# Patient Record
Sex: Male | Born: 1975
Health system: Southern US, Community
[De-identification: ages and names within clinical notes are randomized; demographics above are authoritative.]

## PROBLEM LIST (undated history)

## (undated) ENCOUNTER — Emergency Department (HOSPITAL_COMMUNITY): Discharge status: Against Medical Advice | Payer: No Typology Code available for payment source

## (undated) DIAGNOSIS — J45909 Unspecified asthma, uncomplicated: Secondary | ICD-10-CM

## (undated) HISTORY — PX: APPENDECTOMY: SHX54

---

## 2001-02-27 ENCOUNTER — Emergency Department (HOSPITAL_COMMUNITY): Admission: EM | Admit: 2001-02-27 | Discharge: 2001-02-27 | Payer: Self-pay | Admitting: Emergency Medicine

## 2001-11-15 ENCOUNTER — Emergency Department (HOSPITAL_COMMUNITY): Admission: EM | Admit: 2001-11-15 | Discharge: 2001-11-15 | Payer: Self-pay | Admitting: Emergency Medicine

## 2004-01-14 ENCOUNTER — Emergency Department (HOSPITAL_COMMUNITY): Admission: EM | Admit: 2004-01-14 | Discharge: 2004-01-14 | Payer: Self-pay | Admitting: Emergency Medicine

## 2005-07-19 ENCOUNTER — Emergency Department (HOSPITAL_COMMUNITY): Admission: EM | Admit: 2005-07-19 | Discharge: 2005-07-19 | Payer: Self-pay | Admitting: Emergency Medicine

## 2005-12-19 ENCOUNTER — Emergency Department (HOSPITAL_COMMUNITY): Admission: EM | Admit: 2005-12-19 | Discharge: 2005-12-19 | Payer: Self-pay | Admitting: Emergency Medicine

## 2008-12-14 ENCOUNTER — Emergency Department (HOSPITAL_COMMUNITY): Admission: EM | Admit: 2008-12-14 | Discharge: 2008-12-14 | Payer: Self-pay | Admitting: Emergency Medicine

## 2009-01-26 ENCOUNTER — Emergency Department (HOSPITAL_COMMUNITY): Admission: EM | Admit: 2009-01-26 | Discharge: 2009-01-26 | Payer: Self-pay | Admitting: Emergency Medicine

## 2009-02-18 ENCOUNTER — Emergency Department (HOSPITAL_COMMUNITY): Admission: EM | Admit: 2009-02-18 | Discharge: 2009-02-18 | Payer: Self-pay | Admitting: Emergency Medicine

## 2010-03-28 ENCOUNTER — Emergency Department (HOSPITAL_COMMUNITY)
Admission: EM | Admit: 2010-03-28 | Discharge: 2010-03-28 | Payer: Self-pay | Source: Home / Self Care | Admitting: Emergency Medicine

## 2010-07-24 ENCOUNTER — Emergency Department (HOSPITAL_COMMUNITY): Payer: No Typology Code available for payment source

## 2010-07-24 ENCOUNTER — Emergency Department (HOSPITAL_COMMUNITY)
Admission: EM | Admit: 2010-07-24 | Discharge: 2010-07-24 | Disposition: A | Discharge status: Home / Self Care | Payer: No Typology Code available for payment source | Attending: Emergency Medicine | Admitting: Emergency Medicine

## 2010-07-24 DIAGNOSIS — Y92410 Unspecified street and highway as the place of occurrence of the external cause: Secondary | ICD-10-CM | POA: Insufficient documentation

## 2010-07-24 DIAGNOSIS — S335XXA Sprain of ligaments of lumbar spine, initial encounter: Secondary | ICD-10-CM | POA: Insufficient documentation
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
## 2013-12-18 DIAGNOSIS — N390 Urinary tract infection, site not specified: Secondary | ICD-10-CM | POA: Insufficient documentation

## 2013-12-18 DIAGNOSIS — F17200 Nicotine dependence, unspecified, uncomplicated: Secondary | ICD-10-CM | POA: Insufficient documentation

## 2013-12-18 DIAGNOSIS — J45909 Unspecified asthma, uncomplicated: Secondary | ICD-10-CM | POA: Insufficient documentation

## 2013-12-18 DIAGNOSIS — R1031 Right lower quadrant pain: Secondary | ICD-10-CM | POA: Insufficient documentation

## 2013-12-19 ENCOUNTER — Emergency Department (HOSPITAL_COMMUNITY)
Admission: EM | Admit: 2013-12-19 | Discharge: 2013-12-19 | Disposition: A | Discharge status: Home / Self Care | Payer: No Typology Code available for payment source | Attending: Emergency Medicine | Admitting: Emergency Medicine

## 2013-12-19 ENCOUNTER — Emergency Department (HOSPITAL_COMMUNITY)
Admission: EM | Admit: 2013-12-19 | Discharge: 2013-12-19 | Disposition: A | Discharge status: Home / Self Care | Payer: No Typology Code available for payment source

## 2013-12-19 ENCOUNTER — Encounter (HOSPITAL_COMMUNITY): Payer: Self-pay | Admitting: Emergency Medicine

## 2013-12-19 DIAGNOSIS — N390 Urinary tract infection, site not specified: Secondary | ICD-10-CM

## 2013-12-19 HISTORY — DX: Unspecified asthma, uncomplicated: J45.909

## 2013-12-19 LAB — COMPREHENSIVE METABOLIC PANEL
ALK PHOS: 82 U/L (ref 39–117)
ALT: 11 U/L (ref 0–53)
AST: 14 U/L (ref 0–37)
Albumin: 3.9 g/dL (ref 3.5–5.2)
Anion gap: 12 (ref 5–15)
BUN: 11 mg/dL (ref 6–23)
CHLORIDE: 106 meq/L (ref 96–112)
CO2: 24 meq/L (ref 19–32)
Calcium: 9 mg/dL (ref 8.4–10.5)
Creatinine, Ser: 1.14 mg/dL (ref 0.50–1.35)
GFR, EST NON AFRICAN AMERICAN: 80 mL/min — AB (ref >90)
GLUCOSE: 96 mg/dL (ref 70–99)
POTASSIUM: 4.2 meq/L (ref 3.7–5.3)
SODIUM: 142 meq/L (ref 137–147)
Total Bilirubin: 0.2 mg/dL — ABNORMAL LOW (ref 0.3–1.2)
Total Protein: 7.1 g/dL (ref 6.0–8.3)

## 2013-12-19 LAB — CBC WITH DIFFERENTIAL/PLATELET
BASOS PCT: 0 % (ref 0–1)
Basophils Absolute: 0 10*3/uL (ref 0.0–0.1)
EOS ABS: 0.3 10*3/uL (ref 0.0–0.7)
EOS PCT: 3 % (ref 0–5)
HCT: 42 % (ref 39.0–52.0)
Hemoglobin: 14.2 g/dL (ref 13.0–17.0)
LYMPHS ABS: 2.6 10*3/uL (ref 0.7–4.0)
Lymphocytes Relative: 29 % (ref 12–46)
MCH: 30 pg (ref 26.0–34.0)
MCHC: 33.8 g/dL (ref 30.0–36.0)
MCV: 88.8 fL (ref 78.0–100.0)
MONOS PCT: 6 % (ref 3–12)
Monocytes Absolute: 0.5 10*3/uL (ref 0.1–1.0)
NEUTROS PCT: 62 % (ref 43–77)
Neutro Abs: 5.6 10*3/uL (ref 1.7–7.7)
PLATELETS: 262 10*3/uL (ref 150–400)
RBC: 4.73 MIL/uL (ref 4.22–5.81)
RDW: 13.2 % (ref 11.5–15.5)
WBC: 9 10*3/uL (ref 4.0–10.5)

## 2013-12-19 LAB — URINALYSIS, ROUTINE W REFLEX MICROSCOPIC
BILIRUBIN URINE: NEGATIVE
GLUCOSE, UA: NEGATIVE mg/dL
KETONES UR: NEGATIVE mg/dL
Leukocytes, UA: NEGATIVE
Nitrite: NEGATIVE
PH: 5.5 (ref 5.0–8.0)
PROTEIN: 30 mg/dL — AB
Specific Gravity, Urine: 1.025 (ref 1.005–1.030)
Urobilinogen, UA: 0.2 mg/dL (ref 0.0–1.0)

## 2013-12-19 LAB — URINE MICROSCOPIC-ADD ON

## 2013-12-19 LAB — LIPASE, BLOOD: LIPASE: 34 U/L (ref 11–59)

## 2013-12-19 MED ORDER — OXYCODONE-ACETAMINOPHEN 5-325 MG PO TABS: 2.0000 | ORAL_TABLET | ORAL | Status: DC | PRN

## 2013-12-19 MED ORDER — CEPHALEXIN 500 MG PO CAPS: 500.0000 mg | ORAL_CAPSULE | Freq: Four times a day (QID) | ORAL | Status: DC

## 2013-12-19 MED ORDER — ONDANSETRON 8 MG PO TBDP
8.0000 mg | ORAL_TABLET | Freq: Once | ORAL | Status: AC
  Administered 2013-12-19: 8 mg via ORAL
  Filled 2013-12-19: qty 1

## 2013-12-19 MED ORDER — OXYCODONE-ACETAMINOPHEN 5-325 MG PO TABS
2.0000 | ORAL_TABLET | Freq: Once | ORAL | Status: AC
  Administered 2013-12-19: 2 via ORAL
  Filled 2013-12-19: qty 2

## 2013-12-19 NOTE — ED Notes (Signed)
Called once for triage, no response

## 2013-12-19 NOTE — ED Notes (Signed)
Pt states he started having pain in right lower abd today, states he is also urinating dark urine, denies diff urinating or pain with urination

## 2013-12-19 NOTE — ED Provider Notes (Signed)
CSN: [id number]     Arrival date & time 12/18/13  2351 History   First MD Initiated Contact with Patient 12/19/13 0132     Chief Complaint  Patient presents with  . Abdominal Pain     Patient is a 38 y.o. male presenting with abdominal pain. The history is provided by the patient.  Abdominal Pain Pain location:  RLQ Pain quality: cramping   Pain severity:  Moderate Onset quality:  Sudden Duration:  4 hours Timing:  Constant Progression:  Unchanged Chronicity:  New Relieved by:  Nothing Worsened by:  Nothing tried Associated symptoms: no chest pain, no cough, no diarrhea, no dysuria, no fever and no vomiting   pt reports onset of RLQ pain about 4 hrs ago No trauma No back pain No testicle pain reported No dysuria He has never had this before   Past Medical History  Diagnosis Date  . Asthma    History reviewed. No pertinent past surgical history. No family history on file. History  Substance Use Topics  . Smoking status: Current Every Day Smoker  . Smokeless tobacco: Not on file  . Alcohol Use: No    Review of Systems  Constitutional: Negative for fever.  Respiratory: Negative for cough.   Cardiovascular: Negative for chest pain.  Gastrointestinal: Positive for abdominal pain. Negative for vomiting and diarrhea.  Genitourinary: Negative for dysuria, discharge, scrotal swelling and testicular pain.  All other systems reviewed and are negative.     Allergies  Review of patient's allergies indicates no known allergies.  Home Medications   Prior to Admission medications   Not on File   BP 122/73  Pulse 74  Temp(Src) 98.4 F (36.9 C) (Oral)  Resp 16  Ht 5\' 9"  (1.753 m)  Wt 185 lb (83.915 kg)  BMI 27.31 kg/m2  SpO2 100% Physical Exam CONSTITUTIONAL: Well developed/well nourished HEAD: Normocephalic/atraumatic EYES: EOMI/PERRL ENMT: Mucous membranes moist NECK: supple no meningeal signs SPINE:entire spine nontender CV: S1/S2 noted, no  murmurs/rubs/gallops noted LUNGS: Lungs are clear to auscultation bilaterally, no apparent distress ABDOMEN: soft, mild tenderness in suprapubic region, no rebound or guarding GU:no cva tenderness No scrotal tenderness or erythema is noted.  He is circumcised.  No penile discharge noted NEURO: Pt is awake/alert, moves all extremitiesx4 EXTREMITIES: pulses normal, full ROM SKIN: warm, color normal PSYCH: no abnormalities of mood noted  ED Course  Procedures  Labs Review Labs Reviewed  COMPREHENSIVE METABOLIC PANEL - Abnormal; Notable for the following:    Total Bilirubin 0.2 (*)    GFR calc non Af Amer 80 (*)    All other components within normal limits  URINALYSIS, ROUTINE W REFLEX MICROSCOPIC - Abnormal; Notable for the following:    Color, Urine BROWN (*)    APPearance HAZY (*)    Hgb urine dipstick LARGE (*)    Protein, ur 30 (*)    All other components within normal limits  URINE MICROSCOPIC-ADD ON - Abnormal; Notable for the following:    Bacteria, UA MANY (*)    All other components within normal limits  URINE CULTURE  CBC WITH DIFFERENTIAL  LIPASE, BLOOD    Pt well appearing He has minimal tenderness to suprapubic region I doubt ureteral stone I doubt acute appendicitis or other acute abd process Will send urine culture, start keflex and refer to urology   MDM   Final diagnoses:  UTI (lower urinary tract infection)    Nursing notes including past medical history and social history reviewed and considered  in documentation Labs/vital reviewed and considered     Joya Gaskinsonald W Rhya Shan, MD 12/19/13 [phone]

## 2015-09-07 ENCOUNTER — Emergency Department (HOSPITAL_COMMUNITY)
Admission: EM | Admit: 2015-09-07 | Discharge: 2015-09-07 | Disposition: A | Discharge status: Home / Self Care | Payer: BLUE CROSS/BLUE SHIELD | Attending: Emergency Medicine | Admitting: Emergency Medicine

## 2015-09-07 ENCOUNTER — Emergency Department (HOSPITAL_COMMUNITY): Payer: BLUE CROSS/BLUE SHIELD

## 2015-09-07 ENCOUNTER — Encounter (HOSPITAL_COMMUNITY): Payer: Self-pay | Admitting: Emergency Medicine

## 2015-09-07 DIAGNOSIS — J45909 Unspecified asthma, uncomplicated: Secondary | ICD-10-CM | POA: Diagnosis not present

## 2015-09-07 DIAGNOSIS — F17210 Nicotine dependence, cigarettes, uncomplicated: Secondary | ICD-10-CM | POA: Diagnosis not present

## 2015-09-07 DIAGNOSIS — R51 Headache: Secondary | ICD-10-CM | POA: Insufficient documentation

## 2015-09-07 DIAGNOSIS — W19XXXA Unspecified fall, initial encounter: Secondary | ICD-10-CM

## 2015-09-07 MED ORDER — MORPHINE SULFATE (PF) 4 MG/ML IV SOLN
6.0000 mg | Freq: Once | INTRAVENOUS | Status: AC
Start: 2015-09-07 — End: 2015-09-07
  Administered 2015-09-07: 6 mg via INTRAMUSCULAR
  Filled 2015-09-07: qty 2

## 2015-09-07 MED ORDER — IBUPROFEN 400 MG PO TABS: 400.0000 mg | ORAL_TABLET | Freq: Four times a day (QID) | ORAL | Status: DC | PRN

## 2015-09-07 MED ORDER — KETOROLAC TROMETHAMINE 60 MG/2ML IM SOLN
60.0000 mg | Freq: Once | INTRAMUSCULAR | Status: AC
  Administered 2015-09-07: 60 mg via INTRAMUSCULAR
  Filled 2015-09-07: qty 2

## 2015-09-07 NOTE — ED Notes (Signed)
Pt reports becoming dizzy and falling down steps last night. Reports that he hit his head, but did not lose consciousness. States that, "when I woke up this morning, I just didn't feel right." C/o HA, but denies vomiting. AOx4

## 2015-09-07 NOTE — ED Provider Notes (Signed)
CSN: [id number]     Arrival date & time 09/07/15  1356 History   First MD Initiated Contact with Patient 09/07/15 1407     Chief Complaint  Patient presents with  . Fall     (Consider location/radiation/quality/duration/timing/severity/associated sxs/prior Treatment) Patient is a 40 y.o. male presenting with fall.  Fall This is a new problem. The current episode started 6 to 12 hours ago. Associated symptoms include headaches. Pertinent negatives include no chest pain. Nothing aggravates the symptoms. Nothing relieves the symptoms. He has tried nothing for the symptoms.    Past Medical History  Diagnosis Date  . Asthma    History reviewed. No pertinent past surgical history. No family history on file. Social History  Substance Use Topics  . Smoking status: Current Every Day Smoker -- 0.50 packs/day    Types: Cigarettes  . Smokeless tobacco: None  . Alcohol Use: Yes     Comment: occ    Review of Systems  Constitutional: Negative for fever and chills.  Cardiovascular: Negative for chest pain.  Genitourinary: Negative for dysuria and enuresis.  Neurological: Positive for headaches. Negative for syncope and weakness.  All other systems reviewed and are negative.     Allergies  Review of patient's allergies indicates no known allergies.  Home Medications   Prior to Admission medications   Medication Sig Start Date End Date Taking? Authorizing Provider  ibuprofen (ADVIL,MOTRIN) 400 MG tablet Take 1 tablet (400 mg total) by mouth every 6 (six) hours as needed. 09/07/15   Marily Memos, MD   BP 127/75 mmHg  Pulse 85  Temp(Src) 99 F (37.2 C) (Oral)  Resp 18  Ht  (1.727 m)  Wt 176 lb (79.833 kg)  BMI 26.77 kg/m2  SpO2 95% Physical Exam  Constitutional: He is oriented to person, place, and time. He appears well-developed and well-nourished.  HENT:  Head: Normocephalic and atraumatic.  Neck: Normal range of motion.  Cardiovascular: Normal rate.   Pulmonary/Chest:  Effort normal. No respiratory distress.  Abdominal: He exhibits no distension.  Musculoskeletal: Normal range of motion.  Neurological: He is alert and oriented to person, place, and time. No cranial nerve deficit. Coordination normal.  No altered mental status, able to give full seemingly accurate history.  Face is symmetric, EOM's intact, pupils equal and reactive, vision intact, tongue and uvula midline without deviation Upper and Lower extremity motor 5/5, intact pain perception in distal extremities, 2+ reflexes in biceps, patella and achilles tendons. Finger to nose normal, heel to shin normal. Walks slow, but without assistance or evident ataxia.   Nursing note and vitals reviewed.   ED Course  Procedures (including critical care time) Labs Review Labs Reviewed - No data to display  Imaging Review Ct Head Wo Contrast  09/07/2015  CLINICAL DATA:  Syncopal episode last night.  Fell.  Hit head. EXAM: CT HEAD WITHOUT CONTRAST TECHNIQUE: Contiguous axial images were obtained from the base of the skull through the vertex without intravenous contrast. COMPARISON:  01/26/2009 FINDINGS: The ventricles are normal in size and configuration. No extra-axial fluid collections are identified. The gray-white differentiation is normal. No CT findings for acute intracranial process such as hemorrhage or infarction. No mass lesions. The brainstem and cerebellum are grossly normal. The bony structures are intact. The paranasal sinuses and mastoid air cells are clear. The globes are intact. IMPRESSION: Normal head CT. Electronically Signed   By: Rudie Meyer M.D.   On: 09/07/2015 15:35   I have personally reviewed and evaluated  these images and lab results as part of my medical decision-making.   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 Ct without bleed/fracture. Overall patient appears well. Stable  for dc with return to work activity and precautions described to him.   New Prescriptions: Discharge Medication List as of 09/07/2015  3:50 PM    START taking these medications   Details  ibuprofen (ADVIL,MOTRIN) 400 MG tablet Take 1 tablet (400 mg total) by mouth every 6 (six) hours as needed., Starting 09/07/2015, Until Discontinued, Print         I have personally and contemperaneously reviewed labs and imaging and used in my decision making as above.   A medical screening exam was performed and I feel the patient has had an appropriate workup for their chief complaint at this time and likelihood of emergent condition existing is low and thus workup can continue on an outpatient basis.. Their vital signs are stable. They have been counseled on decision, discharge, follow up and which symptoms necessitate immediate return to the emergency department.  They verbally stated understanding and agreement with plan and discharged in stable condition.      Marily MemosJason Naija Troost, MD 09/07/15 [phone]

## 2015-09-09 ENCOUNTER — Emergency Department (HOSPITAL_COMMUNITY)
Admission: EM | Admit: 2015-09-09 | Discharge: 2015-09-09 | Disposition: A | Discharge status: Home / Self Care | Payer: BLUE CROSS/BLUE SHIELD | Attending: Emergency Medicine | Admitting: Emergency Medicine

## 2015-09-09 ENCOUNTER — Encounter (HOSPITAL_COMMUNITY): Payer: Self-pay | Admitting: Emergency Medicine

## 2015-09-09 DIAGNOSIS — R319 Hematuria, unspecified: Secondary | ICD-10-CM | POA: Insufficient documentation

## 2015-09-09 DIAGNOSIS — R52 Pain, unspecified: Secondary | ICD-10-CM | POA: Diagnosis present

## 2015-09-09 DIAGNOSIS — F17210 Nicotine dependence, cigarettes, uncomplicated: Secondary | ICD-10-CM | POA: Insufficient documentation

## 2015-09-09 DIAGNOSIS — K529 Noninfective gastroenteritis and colitis, unspecified: Secondary | ICD-10-CM | POA: Diagnosis not present

## 2015-09-09 LAB — CBC WITH DIFFERENTIAL/PLATELET
BASOS ABS: 0 10*3/uL (ref 0.0–0.1)
BASOS PCT: 0 %
Eosinophils Absolute: 0.1 10*3/uL (ref 0.0–0.7)
Eosinophils Relative: 1 %
HEMATOCRIT: 45.5 % (ref 39.0–52.0)
HEMOGLOBIN: 15.3 g/dL (ref 13.0–17.0)
Lymphocytes Relative: 25 %
Lymphs Abs: 2.4 10*3/uL (ref 0.7–4.0)
MCH: 30.2 pg (ref 26.0–34.0)
MCHC: 33.6 g/dL (ref 30.0–36.0)
MCV: 89.9 fL (ref 78.0–100.0)
MONO ABS: 0.7 10*3/uL (ref 0.1–1.0)
Monocytes Relative: 8 %
NEUTROS ABS: 6.2 10*3/uL (ref 1.7–7.7)
NEUTROS PCT: 66 %
Platelets: 250 10*3/uL (ref 150–400)
RBC: 5.06 MIL/uL (ref 4.22–5.81)
RDW: 13.1 % (ref 11.5–15.5)
WBC: 9.5 10*3/uL (ref 4.0–10.5)

## 2015-09-09 LAB — COMPREHENSIVE METABOLIC PANEL
ALK PHOS: 73 U/L (ref 38–126)
ALT: 11 U/L — ABNORMAL LOW (ref 17–63)
ANION GAP: 10 (ref 5–15)
AST: 15 U/L (ref 15–41)
Albumin: 4.2 g/dL (ref 3.5–5.0)
BILIRUBIN TOTAL: 0.8 mg/dL (ref 0.3–1.2)
BUN: 11 mg/dL (ref 6–20)
CALCIUM: 9 mg/dL (ref 8.9–10.3)
CO2: 26 mmol/L (ref 22–32)
Chloride: 105 mmol/L (ref 101–111)
Creatinine, Ser: 1.06 mg/dL (ref 0.61–1.24)
Glucose, Bld: 100 mg/dL — ABNORMAL HIGH (ref 65–99)
Potassium: 3.9 mmol/L (ref 3.5–5.1)
Sodium: 141 mmol/L (ref 135–145)
TOTAL PROTEIN: 7.5 g/dL (ref 6.5–8.1)

## 2015-09-09 LAB — URINE MICROSCOPIC-ADD ON

## 2015-09-09 LAB — URINALYSIS, ROUTINE W REFLEX MICROSCOPIC
Bilirubin Urine: NEGATIVE
GLUCOSE, UA: NEGATIVE mg/dL
Ketones, ur: NEGATIVE mg/dL
Nitrite: NEGATIVE
PH: 6 (ref 5.0–8.0)
SPECIFIC GRAVITY, URINE: 1.02 (ref 1.005–1.030)

## 2015-09-09 LAB — LIPASE, BLOOD: LIPASE: 14 U/L (ref 11–51)

## 2015-09-09 MED ORDER — ONDANSETRON HCL 4 MG PO TABS: 4.0000 mg | ORAL_TABLET | Freq: Four times a day (QID) | ORAL | Status: DC

## 2015-09-09 MED ORDER — ONDANSETRON 8 MG PO TBDP
8.0000 mg | ORAL_TABLET | Freq: Once | ORAL | Status: AC
  Administered 2015-09-09: 8 mg via ORAL
  Filled 2015-09-09: qty 1

## 2015-09-09 NOTE — ED Notes (Signed)
Having body aches since yesterday.  C/o chills, temp 100.3 F last night.  Took tylenol ES last night.

## 2015-09-09 NOTE — Discharge Instructions (Signed)
Food Choices to Help Relieve Diarrhea, Adult °When you have diarrhea, the foods you eat and your eating habits are very important. Choosing the right foods and drinks can help relieve diarrhea. Also, because diarrhea can last up to 7 days, you need to replace lost fluids and electrolytes (such as sodium, potassium, and chloride) in order to help prevent dehydration.  °WHAT GENERAL GUIDELINES DO I NEED TO FOLLOW? °· Slowly drink 1 cup (8 oz) of fluid for each episode of diarrhea. If you are getting enough fluid, your urine will be clear or pale yellow. °· Eat starchy foods. Some good choices include white rice, white toast, pasta, low-fiber cereal, baked potatoes (without the skin), saltine crackers, and bagels. °· Avoid large servings of any cooked vegetables. °· Limit fruit to two servings per day. A serving is ½ cup or 1 small piece. °· Choose foods with less than 2 g of fiber per serving. °· Limit fats to less than 8 tsp (38 g) per day. °· Avoid fried foods. °· Eat foods that have probiotics in them. Probiotics can be found in certain dairy products. °· Avoid foods and beverages that may increase the speed at which food moves through the stomach and intestines (gastrointestinal tract). Things to avoid include: °· High-fiber foods, such as dried fruit, raw fruits and vegetables, nuts, seeds, and whole grain foods. °· Spicy foods and high-fat foods. °· Foods and beverages sweetened with high-fructose corn syrup, honey, or sugar alcohols such as xylitol, sorbitol, and mannitol. °WHAT FOODS ARE RECOMMENDED? °Grains °White rice. White, French, or pita breads (fresh or toasted), including plain rolls, buns, or bagels. White pasta. Saltine, soda, or graham crackers. Pretzels. Low-fiber cereal. Cooked cereals made with water (such as cornmeal, farina, or cream cereals). Plain muffins. Matzo. Melba toast. Zwieback.  °Vegetables °Potatoes (without the skin). Strained tomato and vegetable juices. Most well-cooked and canned  vegetables without seeds. Tender lettuce. °Fruits °Cooked or canned applesauce, apricots, cherries, fruit cocktail, grapefruit, peaches, pears, or plums. Fresh bananas, apples without skin, cherries, grapes, cantaloupe, grapefruit, peaches, oranges, or plums.  °Meat and Other Protein Products °Baked or boiled chicken. Eggs. Tofu. Fish. Seafood. Smooth peanut butter. Ground or well-cooked tender beef, ham, veal, lamb, pork, or poultry.  °Dairy °Plain yogurt, kefir, and unsweetened liquid yogurt. Lactose-free milk, buttermilk, or soy milk. Plain hard cheese. °Beverages °Sport drinks. Clear broths. Diluted fruit juices (except prune). Regular, caffeine-free sodas such as ginger ale. Water. Decaffeinated teas. Oral rehydration solutions. Sugar-free beverages not sweetened with sugar alcohols. °Other °Bouillon, broth, or soups made from recommended foods.  °The items listed above may not be a complete list of recommended foods or beverages. Contact your dietitian for more options. °WHAT FOODS ARE NOT RECOMMENDED? °Grains °Whole grain, whole wheat, bran, or rye breads, rolls, pastas, crackers, and cereals. Wild or brown rice. Cereals that contain more than 2 g of fiber per serving. Corn tortillas or taco shells. Cooked or dry oatmeal. Granola. Popcorn. °Vegetables °Raw vegetables. Cabbage, broccoli, Brussels sprouts, artichokes, baked beans, beet greens, corn, kale, legumes, peas, sweet potatoes, and yams. Potato skins. Cooked spinach and cabbage. °Fruits °Dried fruit, including raisins and dates. Raw fruits. Stewed or dried prunes. Fresh apples with skin, apricots, mangoes, pears, raspberries, and strawberries.  °Meat and Other Protein Products °Chunky peanut butter. Nuts and seeds. Beans and lentils. Bacon.  °Dairy °High-fat cheeses. Milk, chocolate milk, and beverages made with milk, such as milk shakes. Cream. Ice cream. °Sweets and Desserts °Sweet rolls, doughnuts, and sweet breads.   Pancakes and waffles. Fats and  Oils Butter. Cream sauces. Margarine. Salad oils. Plain salad dressings. Olives. Avocados.  Beverages Caffeinated beverages (such as coffee, tea, soda, or energy drinks). Alcoholic beverages. Fruit juices with pulp. Prune juice. Soft drinks sweetened with high-fructose corn syrup or sugar alcohols. Other Coconut. Hot sauce. Chili powder. Mayonnaise. Gravy. Cream-based or milk-based soups.  The items listed above may not be a complete list of foods and beverages to avoid. Contact your dietitian for more information. WHAT SHOULD I DO IF I BECOME DEHYDRATED? Diarrhea can sometimes lead to dehydration. Signs of dehydration include dark urine and dry mouth and skin. If you think you are dehydrated, you should rehydrate with an oral rehydration solution. These solutions can be purchased at pharmacies, retail stores, or online.  Drink -1 cup (120-240 mL) of oral rehydration solution each time you have an episode of diarrhea. If drinking this amount makes your diarrhea worse, try drinking smaller amounts more often. For example, drink 1-3 tsp (5-15 mL) every 5-10 minutes.  A general rule for staying hydrated is to drink 1-2 L of fluid per day. Talk to your health care provider about the specific amount you should be drinking each day. Drink enough fluids to keep your urine clear or pale yellow.   This information is not intended to replace advice given to you by your health care provider. Make sure you discuss any questions you have with your health care provider.   Document Released: 07/10/2003 Document Revised: 05/10/2014 Document Reviewed: 03/12/2013 Elsevier Interactive Patient Education 2016 [employer]  Hematuria, Adult Hematuria is blood in your urine. It can be caused by a bladder infection, kidney infection, prostate infection, kidney stone, or cancer of your urinary tract. Infections can usually be treated with medicine, and a kidney stone usually will pass through your urine. If neither of  these is the cause of your hematuria, further workup to find out the reason may be needed. It is very important that you tell your health care provider about any blood you see in your urine, even if the blood stops without treatment or happens without causing pain. Blood in your urine that happens and then stops and then happens again can be a symptom of a very serious condition. Also, pain is not a symptom in the initial stages of many urinary cancers. HOME CARE INSTRUCTIONS   Drink lots of fluid, 3-4 quarts a day. If you have been diagnosed with an infection, cranberry juice is especially recommended, in addition to large amounts of water.  Avoid caffeine, tea, and carbonated beverages because they tend to irritate the bladder.  Avoid alcohol because it may irritate the prostate.  Take all medicines as directed by your health care provider.  If you were prescribed an antibiotic medicine, finish it all even if you start to feel better.  If you have been diagnosed with a kidney stone, follow your health care provider's instructions regarding straining your urine to catch the stone.  Empty your bladder often. Avoid holding urine for long periods of time.  After a bowel movement, women should cleanse front to back. Use each tissue only once.  Empty your bladder before and after sexual intercourse if you are a male. SEEK MEDICAL CARE IF:  You develop back pain.  You have a fever.  You have a feeling of sickness in your stomach (nausea) or vomiting.  Your symptoms are not better in 3 days. Return sooner if you are getting worse. SEEK IMMEDIATE MEDICAL CARE  IF:   You develop severe vomiting and are unable to keep the medicine down.  You develop severe back or abdominal pain despite taking your medicines.  You begin passing a large amount of blood or clots in your urine.  You feel extremely weak or faint, or you pass out. MAKE SURE YOU:   Understand these instructions.  Will  watch your condition.  Will get help right away if you are not doing well or get worse.   This information is not intended to replace advice given to you by your health care provider. Make sure you discuss any questions you have with your health care provider.   Document Released: 04/19/2005 Document Revised: 05/10/2014 Document Reviewed: 12/18/2012 Else[employer] Interactive Patient Education [employer]

## 2015-09-11 LAB — URINE CULTURE: CULTURE: NO GROWTH

## 2015-09-11 NOTE — ED Provider Notes (Signed)
CSN: [id number]     Arrival date & time 09/09/15  1136 History   First MD Initiated Contact with Patient 09/09/15 1205     Chief Complaint  Patient presents with  . Generalized Body Aches     (Consider location/radiation/quality/duration/timing/severity/associated sxs/prior Treatment) HPI   Caleb Lee is a 40 y.o. male who presents to the Emergency Department complaining of generalized body aches, diarrhea and nausea.  Onset of symptoms one day prior.  He also reports having chills and low grade fever.  Taking tylenol without relief.  He reports generalized abdominal cramping as well. He has been drinking fluids without difficulty.  He denies chest pain, shortness of breath, vomiting and rash.    Past Medical History  Diagnosis Date  . Asthma    History reviewed. No pertinent past surgical history. History reviewed. No pertinent family history. Social History  Substance Use Topics  . Smoking status: Current Every Day Smoker -- 0.50 packs/day    Types: Cigarettes  . Smokeless tobacco: None  . Alcohol Use: Yes     Comment: occ    Review of Systems  Constitutional: Positive for fever, chills and appetite change.  Respiratory: Negative for shortness of breath.   Cardiovascular: Negative for chest pain.  Gastrointestinal: Positive for nausea, abdominal pain and diarrhea. Negative for vomiting and blood in stool.  Genitourinary: Negative for dysuria, flank pain, decreased urine volume and difficulty urinating.  Musculoskeletal: Negative for back pain.  Skin: Negative for color change and rash.  Neurological: Negative for dizziness, weakness and numbness.  Hematological: Negative for adenopathy.  All other systems reviewed and are negative.     Allergies  Review of patient's allergies indicates no known allergies.  Home Medications   Prior to Admission medications   Medication Sig Start Date End Date Taking? Authorizing Provider  ibuprofen (ADVIL,MOTRIN) 400 MG tablet  Take 1 tablet (400 mg total) by mouth every 6 (six) hours as needed. 09/07/15   Marily Memos, MD  ondansetron (ZOFRAN) 4 MG tablet Take 1 tablet (4 mg total) by mouth every 6 (six) hours. 09/09/15   Brunella Wileman, PA-C   BP 110/70 mmHg  Pulse 64  Temp(Src) 98.6 F (37 C) (Oral)  Resp 16  Ht  (1.753 m)  Wt 79.833 kg  BMI 25.98 kg/m2  SpO2 100% Physical Exam  Constitutional: He is oriented to person, place, and time. He appears well-developed and well-nourished. No distress.  HENT:  Head: Normocephalic and atraumatic.  Mouth/Throat: Oropharynx is clear and moist.  Neck: Normal range of motion. Neck supple.  Cardiovascular: Normal rate, regular rhythm and intact distal pulses.   No murmur heard. Pulmonary/Chest: Effort normal and breath sounds normal. No respiratory distress.  Abdominal: Soft. Normal appearance and bowel sounds are normal. He exhibits no distension and no mass. There is generalized tenderness. There is no rigidity, no rebound, no guarding and no tenderness at McBurney's point.  Musculoskeletal: Normal range of motion. He exhibits no edema.  Neurological: He is alert and oriented to person, place, and time. He exhibits normal muscle tone. Coordination normal.  Skin: Skin is warm and dry.  Nursing note and vitals reviewed.   ED Course  Procedures (including critical care time) Labs Review Labs Reviewed  COMPREHENSIVE METABOLIC PANEL - Abnormal; Notable for the following:    Glucose, Bld 100 (*)    ALT 11 (*)    All other components within normal limits  URINALYSIS, ROUTINE W REFLEX MICROSCOPIC (NOT AT [facility]) - Abnormal; Notable  for the following:    Hgb urine dipstick MODERATE (*)    Protein, ur TRACE (*)    Leukocytes, UA TRACE (*)    All other components within normal limits  URINE MICROSCOPIC-ADD ON - Abnormal; Notable for the following:    Squamous Epithelial / LPF 0-5 (*)    Bacteria, UA RARE (*)    All other components within normal limits  URINE  CULTURE  CBC WITH DIFFERENTIAL/PLATELET  LIPASE, BLOOD    Imaging Review No results found. I have personally reviewed and evaluated these images and lab results as part of my medical decision-making.   EKG Interpretation None      MDM   Final diagnoses:  Gastroenteritis  Hematuria    Pt well appearing, non-toxic.  Labs reassuring and patient feeling better after medications.  Low clinical suspicion for surgical abdomen.  Likely gastroenteritis.  Hematuria likely incidental finding or possibly associated with dehydration.  Urine culture pending  Pt also seen by Dr. Adriana Simasook and care plan discussed.    Pt appears stable for d/c and agrees to increase fluids, continue tylenol and rx for zofran given.  Strict return precautions given and PMD f/u.      Pauline Ausammy Birch Farino, PA-C 09/11/15 2311  Donnetta HutchingBrian Cook, MD 09/13/15 1004

## 2016-01-15 ENCOUNTER — Emergency Department (HOSPITAL_COMMUNITY)
Admission: EM | Admit: 2016-01-15 | Discharge: 2016-01-15 | Disposition: A | Discharge status: Home / Self Care | Payer: BLUE CROSS/BLUE SHIELD | Attending: Emergency Medicine | Admitting: Emergency Medicine

## 2016-01-15 ENCOUNTER — Encounter (HOSPITAL_COMMUNITY): Payer: Self-pay | Admitting: *Deleted

## 2016-01-15 DIAGNOSIS — F17210 Nicotine dependence, cigarettes, uncomplicated: Secondary | ICD-10-CM | POA: Diagnosis not present

## 2016-01-15 DIAGNOSIS — J45909 Unspecified asthma, uncomplicated: Secondary | ICD-10-CM | POA: Insufficient documentation

## 2016-01-15 DIAGNOSIS — H109 Unspecified conjunctivitis: Secondary | ICD-10-CM | POA: Diagnosis not present

## 2016-01-15 DIAGNOSIS — H578 Other specified disorders of eye and adnexa: Secondary | ICD-10-CM | POA: Diagnosis present

## 2016-01-15 MED ORDER — TOBRAMYCIN 0.3 % OP SOLN: 2.0000 [drp] | OPHTHALMIC | 0 refills | Status: DC

## 2016-01-15 NOTE — ED Provider Notes (Signed)
[facility] Provider Note   CSN: [id number] Arrival date & time: 01/15/16  1627     History   Chief Complaint Chief Complaint  Patient presents with  . Eye Problem    HPI Caleb Lee is a 40 y.o. male.  The history is provided by the patient. No language interpreter was used.  Eye Problem   This is a new problem. The current episode started 2 days ago. The problem occurs constantly. The problem has been gradually worsening. There is a problem in both eyes. There was no injury mechanism. The pain is moderate. There is no history of trauma to the eye. There is no known exposure to pink eye. He does not wear contacts. Associated symptoms include eye redness and itching. He has tried nothing for the symptoms.    Past Medical History:  Diagnosis Date  . Asthma     There are no active problems to display for this patient.   History reviewed. No pertinent surgical history.     Home Medications    Prior to Admission medications   Medication Sig Start Date End Date Taking? Authorizing Provider  ibuprofen (ADVIL,MOTRIN) 400 MG tablet Take 1 tablet (400 mg total) by mouth every 6 (six) hours as needed. 09/07/15   Marily MemosJason Mesner, MD  ondansetron (ZOFRAN) 4 MG tablet Take 1 tablet (4 mg total) by mouth every 6 (six) hours. 09/09/15   Tammy Triplett, PA-C  tobramycin (TOBREX) 0.3 % ophthalmic solution Place 2 drops into both eyes every 4 (four) hours. 01/15/16   Elson AreasLeslie K Sofia, PA-C    Family History History reviewed. No pertinent family history.  Social History Social History  Substance Use Topics  . Smoking status: Current Every Day Smoker    Packs/day: 0.50    Types: Cigarettes  . Smokeless tobacco: Never Used  . Alcohol use Yes     Comment: occ     Allergies   Review of patient's allergies indicates no known allergies.   Review of Systems Review of Systems  Eyes: Positive for redness.  Skin: Positive for itching.  All other systems reviewed and are  negative.    Physical Exam Updated Vital Signs BP 108/57 (BP Location: Left Arm)   Pulse 80   Temp 98.1 F (36.7 C) (Oral)   Resp 20   Ht 5\' 8"  (1.727 m)   Wt 77.1 kg   SpO2 100%   BMI 25.85 kg/m   Physical Exam  Constitutional: He appears well-developed and well-nourished.  HENT:  Head: Normocephalic and atraumatic.  Eyes: Conjunctivae and EOM are normal. Pupils are equal, round, and reactive to light. Right eye exhibits discharge. Left eye exhibits discharge.  Neck: Neck supple.  Cardiovascular: Normal rate and regular rhythm.   No murmur heard. Pulmonary/Chest: Effort normal and breath sounds normal. No respiratory distress.  Abdominal: Soft. There is no tenderness.  Musculoskeletal: He exhibits no edema.  Neurological: He is alert.  Skin: Skin is warm and dry.  Psychiatric: He has a normal mood and affect.  Nursing note and vitals reviewed.    ED Treatments / Results  Labs (all labs ordered are listed, but only abnormal results are displayed) Labs Reviewed - No data to display  EKG  EKG Interpretation None       Radiology No results found.  Procedures Procedures (including critical care time)  Medications Ordered in ED Medications - No data to display   Initial Impression / Assessment and Plan / ED Course  I have  reviewed the triage vital signs and the nursing notes.  Pertinent labs & imaging results that were available during my care of the patient were reviewed by me and considered in my medical decision making (see chart for details).  Clinical Course    Pt counseled on conjunctivitis.  Moist compresses,  Out of work x 24 hours   Final Clinical Impressions(s) / ED Diagnoses   Final diagnoses:  Bilateral conjunctivitis    New Prescriptions Discharge Medication List as of 01/15/2016  5:49 PM    START taking these medications   Details  tobramycin (TOBREX) 0.3 % ophthalmic solution Place 2 drops into both eyes every 4 (four) hours.,  Starting Thu 01/15/2016, Print      An After Visit Summary was printed and given to the patient.   Lonia Skinner [place], PA-C 01/15/16 1854    Raeford Razor, MD 01/18/16 [phone]

## 2016-01-15 NOTE — ED Triage Notes (Signed)
Eyes swelling with itching

## 2016-03-19 ENCOUNTER — Emergency Department (HOSPITAL_COMMUNITY)
Admission: EM | Admit: 2016-03-19 | Discharge: 2016-03-19 | Disposition: A | Discharge status: Home / Self Care | Payer: BLUE CROSS/BLUE SHIELD | Attending: Emergency Medicine | Admitting: Emergency Medicine

## 2016-03-19 ENCOUNTER — Emergency Department (HOSPITAL_COMMUNITY): Payer: BLUE CROSS/BLUE SHIELD

## 2016-03-19 ENCOUNTER — Encounter (HOSPITAL_COMMUNITY): Payer: Self-pay | Admitting: Cardiology

## 2016-03-19 DIAGNOSIS — F17210 Nicotine dependence, cigarettes, uncomplicated: Secondary | ICD-10-CM | POA: Insufficient documentation

## 2016-03-19 DIAGNOSIS — J45909 Unspecified asthma, uncomplicated: Secondary | ICD-10-CM | POA: Insufficient documentation

## 2016-03-19 DIAGNOSIS — R112 Nausea with vomiting, unspecified: Secondary | ICD-10-CM | POA: Insufficient documentation

## 2016-03-19 DIAGNOSIS — M549 Dorsalgia, unspecified: Secondary | ICD-10-CM | POA: Diagnosis not present

## 2016-03-19 DIAGNOSIS — R319 Hematuria, unspecified: Secondary | ICD-10-CM | POA: Diagnosis not present

## 2016-03-19 DIAGNOSIS — Z791 Long term (current) use of non-steroidal anti-inflammatories (NSAID): Secondary | ICD-10-CM | POA: Insufficient documentation

## 2016-03-19 DIAGNOSIS — R1084 Generalized abdominal pain: Secondary | ICD-10-CM | POA: Diagnosis present

## 2016-03-19 LAB — CBC WITH DIFFERENTIAL/PLATELET
Basophils Absolute: 0 10*3/uL (ref 0.0–0.1)
Basophils Relative: 0 %
EOS ABS: 0 10*3/uL (ref 0.0–0.7)
Eosinophils Relative: 0 %
HEMATOCRIT: 44.7 % (ref 39.0–52.0)
HEMOGLOBIN: 15.3 g/dL (ref 13.0–17.0)
LYMPHS ABS: 1.9 10*3/uL (ref 0.7–4.0)
LYMPHS PCT: 18 %
MCH: 30.7 pg (ref 26.0–34.0)
MCHC: 34.2 g/dL (ref 30.0–36.0)
MCV: 89.8 fL (ref 78.0–100.0)
MONOS PCT: 5 %
Monocytes Absolute: 0.5 10*3/uL (ref 0.1–1.0)
NEUTROS PCT: 77 %
Neutro Abs: 8.2 10*3/uL — ABNORMAL HIGH (ref 1.7–7.7)
Platelets: 270 10*3/uL (ref 150–400)
RBC: 4.98 MIL/uL (ref 4.22–5.81)
RDW: 13 % (ref 11.5–15.5)
WBC: 10.6 10*3/uL — AB (ref 4.0–10.5)

## 2016-03-19 LAB — COMPREHENSIVE METABOLIC PANEL
ALBUMIN: 4.4 g/dL (ref 3.5–5.0)
ALK PHOS: 72 U/L (ref 38–126)
ALT: 13 U/L — AB (ref 17–63)
ANION GAP: 8 (ref 5–15)
AST: 17 U/L (ref 15–41)
BUN: 8 mg/dL (ref 6–20)
CALCIUM: 8.8 mg/dL — AB (ref 8.9–10.3)
CHLORIDE: 108 mmol/L (ref 101–111)
CO2: 23 mmol/L (ref 22–32)
CREATININE: 0.96 mg/dL (ref 0.61–1.24)
GFR calc Af Amer: >60 mL/min (ref >60)
GFR calc non Af Amer: >60 mL/min (ref >60)
GLUCOSE: 90 mg/dL (ref 65–99)
Potassium: 4.3 mmol/L (ref 3.5–5.1)
SODIUM: 139 mmol/L (ref 135–145)
Total Bilirubin: 1 mg/dL (ref 0.3–1.2)
Total Protein: 7.3 g/dL (ref 6.5–8.1)

## 2016-03-19 LAB — URINALYSIS, ROUTINE W REFLEX MICROSCOPIC
BILIRUBIN URINE: NEGATIVE
Glucose, UA: NEGATIVE mg/dL
Ketones, ur: >80 mg/dL — AB
Leukocytes, UA: NEGATIVE
NITRITE: NEGATIVE
PH: 5.5 (ref 5.0–8.0)
Protein, ur: NEGATIVE mg/dL
SPECIFIC GRAVITY, URINE: 1.02 (ref 1.005–1.030)

## 2016-03-19 LAB — URINE MICROSCOPIC-ADD ON

## 2016-03-19 LAB — LIPASE, BLOOD: Lipase: 16 U/L (ref 11–51)

## 2016-03-19 MED ORDER — SUCRALFATE 1 G PO TABS: 1.0000 g | ORAL_TABLET | Freq: Three times a day (TID) | ORAL | Status: DC

## 2016-03-19 MED ORDER — SUCRALFATE 1 G PO TABS: 1.0000 g | ORAL_TABLET | Freq: Three times a day (TID) | ORAL | 0 refills | Status: DC

## 2016-03-19 MED ORDER — MORPHINE SULFATE (PF) 4 MG/ML IV SOLN
4.0000 mg | Freq: Once | INTRAVENOUS | Status: AC
  Administered 2016-03-19: 4 mg via INTRAVENOUS
  Filled 2016-03-19: qty 1

## 2016-03-19 MED ORDER — FAMOTIDINE 20 MG PO TABS: 20.0000 mg | ORAL_TABLET | Freq: Two times a day (BID) | ORAL | 0 refills | Status: DC

## 2016-03-19 MED ORDER — KETOROLAC TROMETHAMINE 30 MG/ML IJ SOLN
30.0000 mg | Freq: Once | INTRAMUSCULAR | Status: AC
Start: 2016-03-19 — End: 2016-03-19
  Administered 2016-03-19: 30 mg via INTRAVENOUS
  Filled 2016-03-19: qty 1

## 2016-03-19 MED ORDER — GI COCKTAIL ~~LOC~~
30.0000 mL | Freq: Once | ORAL | Status: AC
  Administered 2016-03-19: 30 mL via ORAL
  Filled 2016-03-19: qty 30

## 2016-03-19 MED ORDER — ONDANSETRON HCL 4 MG PO TABS: 4.0000 mg | ORAL_TABLET | Freq: Three times a day (TID) | ORAL | 0 refills | Status: DC | PRN

## 2016-03-19 MED ORDER — METHOCARBAMOL 500 MG PO TABS: 500.0000 mg | ORAL_TABLET | Freq: Four times a day (QID) | ORAL | 0 refills | Status: DC | PRN

## 2016-03-19 MED ORDER — SODIUM CHLORIDE 0.9 % IV BOLUS (SEPSIS)
500.0000 mL | Freq: Once | INTRAVENOUS | Status: AC
  Administered 2016-03-19: 500 mL via INTRAVENOUS

## 2016-03-19 MED ORDER — ONDANSETRON HCL 4 MG/2ML IJ SOLN
4.0000 mg | Freq: Once | INTRAMUSCULAR | Status: AC
Start: 2016-03-19 — End: 2016-03-19
  Administered 2016-03-19: 4 mg via INTRAVENOUS
  Filled 2016-03-19: qty 2

## 2016-03-19 MED ORDER — FAMOTIDINE IN NACL 20-0.9 MG/50ML-% IV SOLN
20.0000 mg | Freq: Once | INTRAVENOUS | Status: AC
  Administered 2016-03-19: 20 mg via INTRAVENOUS
  Filled 2016-03-19: qty 50

## 2016-03-19 NOTE — ED Provider Notes (Signed)
[facility] Provider Note   CSN: [id number] Arrival date & time: 03/19/16  0820     History   Chief Complaint Chief Complaint  Patient presents with  . Hematemesis    HPI Caleb Lee is a 40 y.o. male.  HPI   Pt with hx kidney stone presents with diffuse crampy abdominal pain that began this morning, two episodes of brown emesis with blood in it.  Has had headache and back pain x 6 days, taking 800mg  ibuprofen Q 8 hours.  Notes his urine is dark.  The low back is painful all the way across, described as stiffness, worse with movement.  The headache has been intermittent, is currently mild.  Denies fevers, chills, myalgias, dysuria, urinary frequency or urgency, change in bowel habits.  Last BM was this morning and was normal.  No melena or hematochezia.  No hx PUD.  Drinks alcohol but reports "not much."   History of abdominal surgeries: appendectomy only, as a child.   Denies any abnormal foods, falls or trauma recently.  Does do heavy lifting but denies any recent known injury.   Past Medical History:  Diagnosis Date  . Asthma     There are no active problems to display for this patient.   Past Surgical History:  Procedure Laterality Date  . APPENDECTOMY         Home Medications    Prior to Admission medications   Medication Sig Start Date End Date Taking? Authorizing Provider  ibuprofen (ADVIL,MOTRIN) 400 MG tablet Take 1 tablet (400 mg total) by mouth every 6 (six) hours as needed. 09/07/15  Yes Marily MemosJason Mesner, MD  famotidine (PEPCID) 20 MG tablet Take 1 tablet (20 mg total) by mouth 2 (two) times daily. 03/19/16   Trixie DredgeEmily Saul Dorsi, PA-C  methocarbamol (ROBAXIN) 500 MG tablet Take 1-2 tablets (500-1,000 mg total) by mouth every 6 (six) hours as needed for muscle spasms (and pain). 03/19/16   Trixie DredgeEmily Edman Lipsey, PA-C  ondansetron (ZOFRAN) 4 MG tablet Take 1 tablet (4 mg total) by mouth every 8 (eight) hours as needed for nausea or vomiting. 03/19/16   Trixie DredgeEmily Larisha Vencill, PA-C    sucralfate (CARAFATE) 1 g tablet Take 1 tablet (1 g total) by mouth 4 (four) times daily -  with meals and at bedtime. 03/19/16   Trixie DredgeEmily Diara Chaudhari, PA-C  tobramycin (TOBREX) 0.3 % ophthalmic solution Place 2 drops into both eyes every 4 (four) hours. 01/15/16   Elson AreasLeslie K Sofia, PA-C    Family History History reviewed. No pertinent family history.  Social History Social History  Substance Use Topics  . Smoking status: Current Every Day Smoker    Packs/day: 0.50    Types: Cigarettes  . Smokeless tobacco: Never Used  . Alcohol use Yes     Comment: occ     Allergies   Patient has no known allergies.   Review of Systems Review of Systems  All other systems reviewed and are negative.    Physical Exam Updated Vital Signs BP 116/62 (BP Location: Right Arm)   Pulse 60   Temp 98.4 F (36.9 C) (Oral)   Resp 18   Ht 5\' 8"  (1.727 m)   Wt 78 kg   SpO2 97%   BMI 26.15 kg/m   Physical Exam  Constitutional: He appears well-developed and well-nourished. No distress.  HENT:  Head: Normocephalic and atraumatic.  Neck: Neck supple.  Cardiovascular: Normal rate and regular rhythm.   Pulmonary/Chest: Effort normal and breath sounds normal. No respiratory  distress. He has no wheezes. He has no rales.  Abdominal: Soft. He exhibits no distension and no mass. There is generalized tenderness. There is no rigidity, no rebound, no guarding and no CVA tenderness.  Neurological: He is alert. He exhibits normal muscle tone.  Skin: He is not diaphoretic.  Nursing note and vitals reviewed.    ED Treatments / Results  Labs (all labs ordered are listed, but only abnormal results are displayed) Labs Reviewed  COMPREHENSIVE METABOLIC PANEL - Abnormal; Notable for the following:       Result Value   Calcium 8.8 (*)    ALT 13 (*)    All other components within normal limits  URINALYSIS, ROUTINE W REFLEX MICROSCOPIC (NOT AT [facility]) - Abnormal; Notable for the following:    APPearance HAZY (*)    Hgb  urine dipstick MODERATE (*)    Ketones, ur >80 (*)    All other components within normal limits  CBC WITH DIFFERENTIAL/PLATELET - Abnormal; Notable for the following:    WBC 10.6 (*)    Neutro Abs 8.2 (*)    All other components within normal limits  URINE MICROSCOPIC-ADD ON - Abnormal; Notable for the following:    Squamous Epithelial / LPF 0-5 (*)    Bacteria, UA RARE (*)    All other components within normal limits  LIPASE, BLOOD    EKG  EKG Interpretation None       Radiology Ct Renal Stone Study  Result Date: 03/19/2016 CLINICAL DATA:  ALL OVER ABD PAIN, VOMITING BLOOD THIS AM, PT STATES BACK HURTS LIKE PRIOR KIDNEY STONES. DENIES HEMATURIA EXAM: CT ABDOMEN AND PELVIS WITHOUT CONTRAST TECHNIQUE: Multidetector CT imaging of the abdomen and pelvis was performed following the standard protocol without IV contrast. COMPARISON:  None. FINDINGS: Lower chest: No acute abnormality. Hepatobiliary: No focal liver abnormality is seen. No gallstones, gallbladder wall thickening, or biliary dilatation. Pancreas: Unremarkable. No pancreatic ductal dilatation or surrounding inflammatory changes. Spleen: No splenic injury or perisplenic hematoma. Adrenals/Urinary Tract: No adrenal masses. There small nonobstructing intrarenal stones. No renal masses. No hydronephrosis. Ureters are normal course and caliber. No ureteral stones. Normal bladder. Stomach/Bowel: Stomach, small bowel and colon are unremarkable. Appendix is surgically absent. Vascular/Lymphatic: Minimal distal abdominal aortic atherosclerotic calcifications. Iliac artery atherosclerotic calcifications. No aneurysm. No adenopathy. Reproductive: Prostate is unremarkable. Other: No abdominal wall hernia or abnormality. No abdominopelvic ascites. Musculoskeletal: No acute or significant osseous findings. IMPRESSION: 1. No acute findings. No findings to account for this patient's abdominal pain or hematemesis. 2. No ureteral stone or obstructive  uropathy. Small bilateral nonobstructing intrarenal stones. 3. Minimal aortic atherosclerosis. Electronically Signed   By: Amie Portlandavid  Ormond M.D.   On: 03/19/2016 12:17    Procedures Procedures (including critical care time)  Medications Ordered in ED Medications  ondansetron (ZOFRAN) injection 4 mg (4 mg Intravenous Given 03/19/16 0908)  morphine 4 MG/ML injection 4 mg (4 mg Intravenous Given 03/19/16 0908)  famotidine (PEPCID) IVPB 20 mg premix (0 mg Intravenous Stopped 03/19/16 0940)  sodium chloride 0.9 % bolus 500 mL (0 mLs Intravenous Stopped 03/19/16 0948)  ketorolac (TORADOL) 30 MG/ML injection 30 mg (30 mg Intravenous Given 03/19/16 1044)  gi cocktail (Maalox,Lidocaine,Donnatal) (30 mLs Oral Given 03/19/16 1045)  morphine 4 MG/ML injection 4 mg (4 mg Intravenous Given 03/19/16 1240)     Initial Impression / Assessment and Plan / ED Course  I have reviewed the triage vital signs and the nursing notes.  Pertinent labs & imaging results that  were available during my care of the patient were reviewed by me and considered in my medical decision making (see chart for details).  Clinical Course     Afebrile nontoxic patient with abdominal pain and vomiting that began this morning.  Has also had headache and back pain that began 6 days ago.  Abdominal exam is nonsurgical.  Has been taking 800mg  ibuprofen Q8hrs since back pain began.  Suspect gastric irritation from NSAIDs may have contributed to reported blood in emesis.  No further vomiting in department.  Labs unremarkable.  UA does not appear infected.  Pt does have hematuria on UA and has in the past as well, CT negative for stone.  Pt advised of this and encouraged pt to follow up with PCP for recheck, possible need for further referral.  Pt advised to stop NSAIDs. Discussed result, findings, treatment, and follow up  with patient.  Pt given return precautions.  Pt verbalizes understanding and agrees with plan.      Final Clinical  Impressions(s) / ED Diagnoses   Final diagnoses:  Generalized abdominal pain  Hematuria, unspecified type  Musculoskeletal back pain  Nausea and vomiting, intractability of vomiting not specified, unspecified vomiting type    New Prescriptions Discharge Medication List as of 03/19/2016 12:56 PM    START taking these medications   Details  famotidine (PEPCID) 20 MG tablet Take 1 tablet (20 mg total) by mouth 2 (two) times daily., Starting Fri 03/19/2016, Print    methocarbamol (ROBAXIN) 500 MG tablet Take 1-2 tablets (500-1,000 mg total) by mouth every 6 (six) hours as needed for muscle spasms (and pain)., Starting Fri 03/19/2016, Print    ondansetron (ZOFRAN) 4 MG tablet Take 1 tablet (4 mg total) by mouth every 8 (eight) hours as needed for nausea or vomiting., Starting Fri 03/19/2016, Print    sucralfate (CARAFATE) 1 g tablet Take 1 tablet (1 g total) by mouth 4 (four) times daily -  with meals and at bedtime., Starting Fri 03/19/2016, Print         [place], PA-C 03/19/16 1545    Maia Plan, MD 03/19/16 [phone]

## 2016-03-19 NOTE — ED Triage Notes (Signed)
Vomited times 2 this morning.  States his emesis was bloody.  C/o stomach cramping, head and lower back pain.

## 2016-03-19 NOTE — ED Notes (Signed)
Patient ambulatory to restroom with steady gait, clean catch instructions given and advised pt to bring specimen back to room as well.  

## 2016-03-19 NOTE — Discharge Instructions (Signed)
Read the information below.  Use the prescribed medication as directed.  Please discuss all new medications with your pharmacist.  You may return to the Emergency Department at any time for worsening condition or any new symptoms that concern you.   If you develop high fevers, worsening abdominal pain, uncontrolled vomiting, or are unable to tolerate fluids by mouth, return to the ER for a recheck.  ° °

## 2018-05-07 IMAGING — CT CT HEAD W/O CM
1 series · 16 of 30 positions shown, 20 images · non-contrast
Comparison: 01/26/2009

CLINICAL DATA: Syncopal episode last night.  Fell.  Hit head.

EXAM:
CT HEAD WITHOUT CONTRAST
TECHNIQUE: Contiguous axial images were obtained from the base of the skull
through the vertex without intravenous contrast.

[Series 2: headtrauma 4.8 h37s · axial · 0.43mm/px · z∈[+116,+251]mm · 16 of 30 slices shown, 20 images]
[im 2/30  brain]
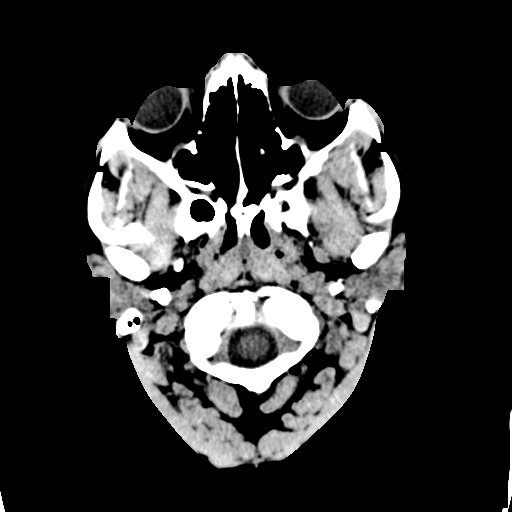
[im 2/30  bone]
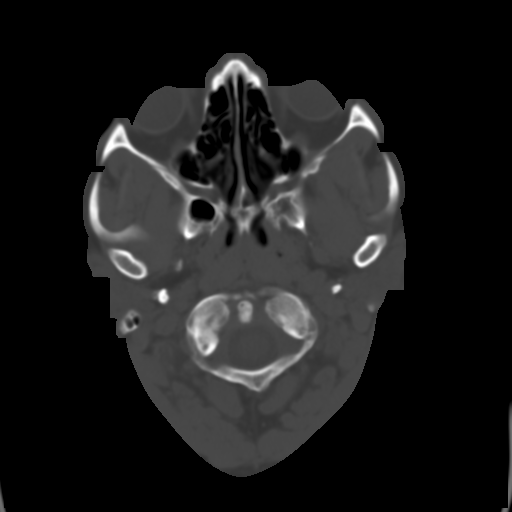
[im 4/30  brain]
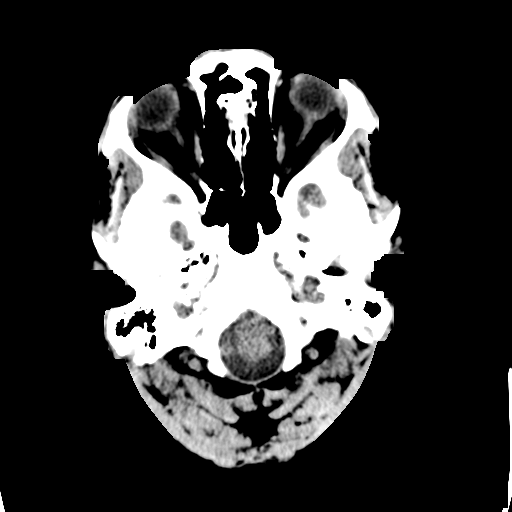
[im 6/30  brain]
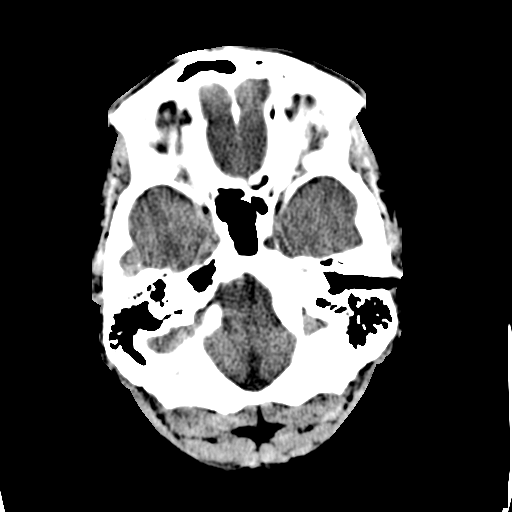
[im 8/30  brain]
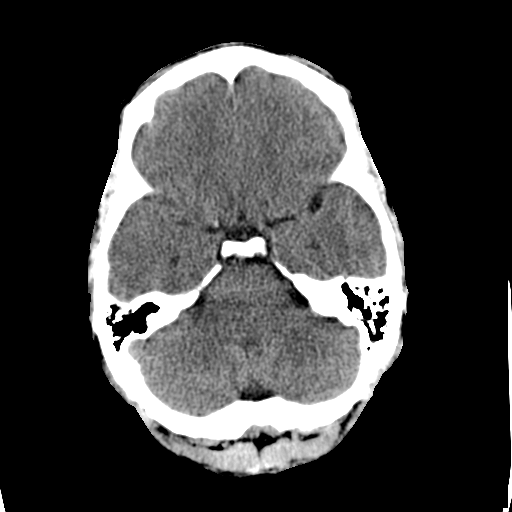
[im 9/30  brain]
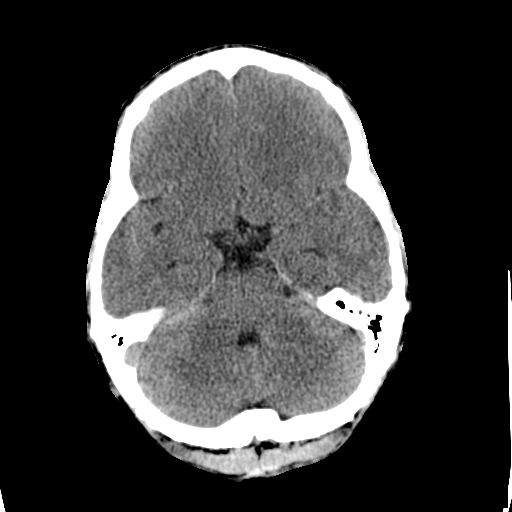
[im 9/30  bone]
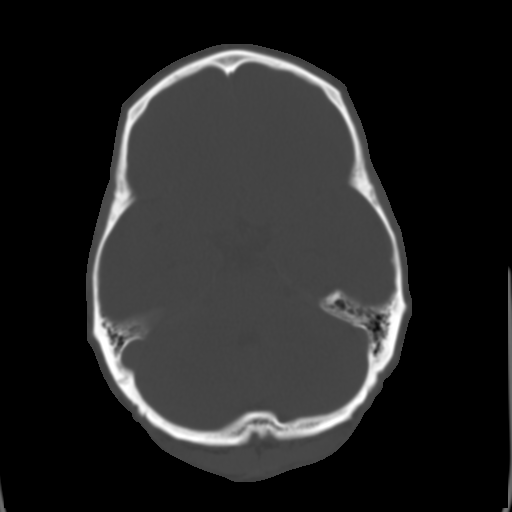
[im 11/30  brain]
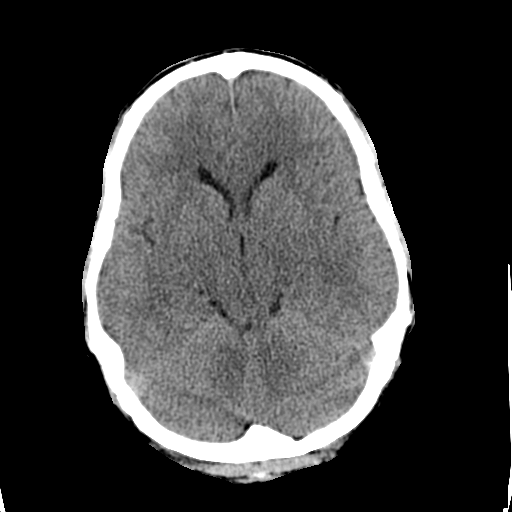
[im 13/30  brain]
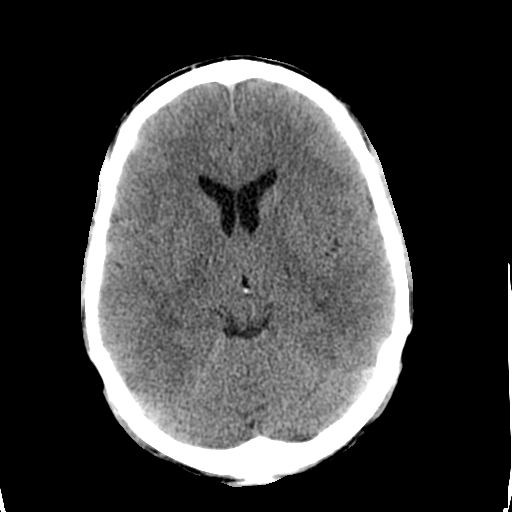
[im 15/30  brain]
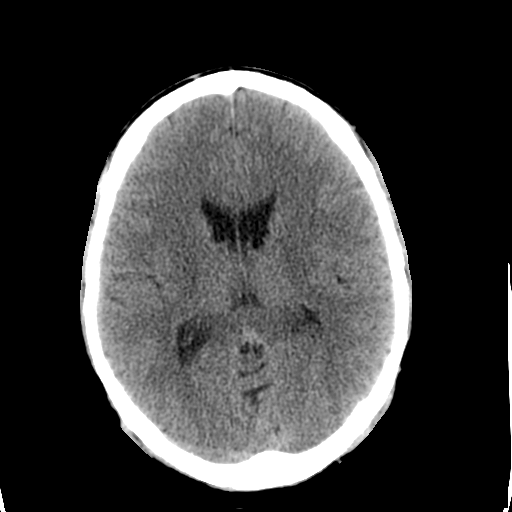
[im 16/30  brain]
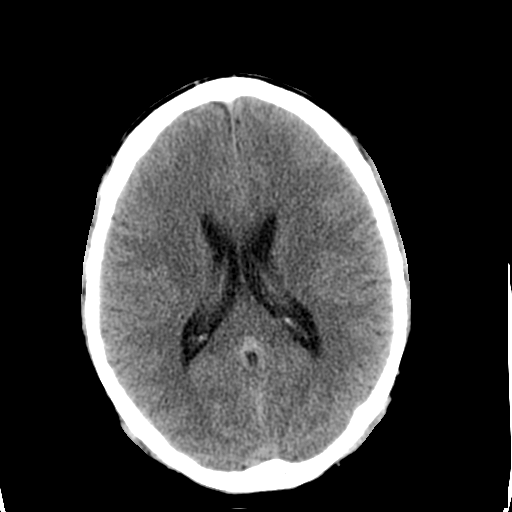
[im 16/30  bone]
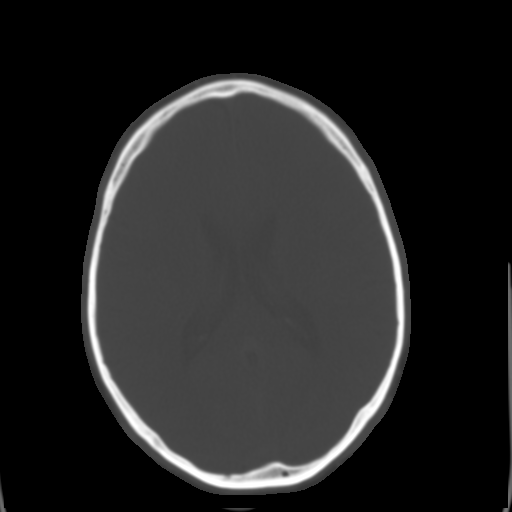
[im 18/30  brain]
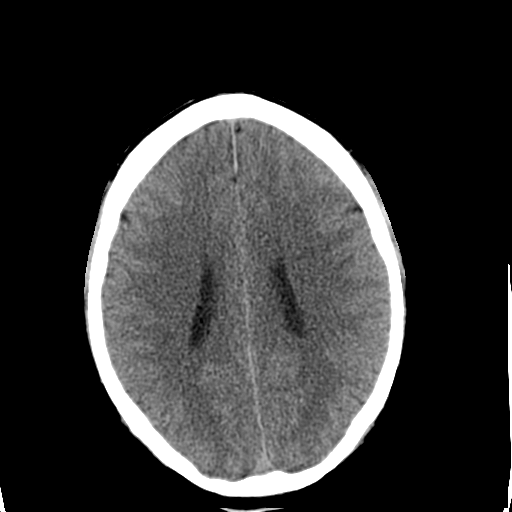
[im 20/30  brain]
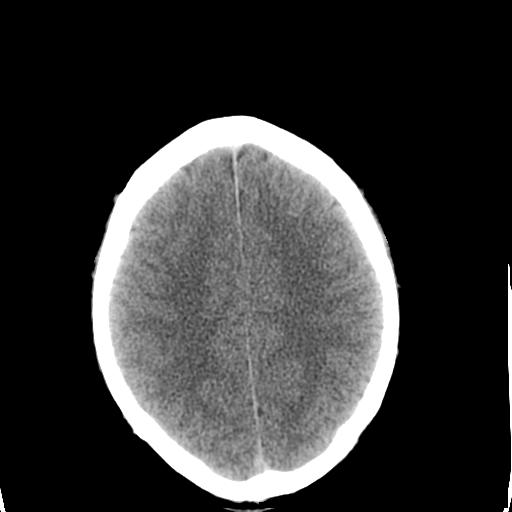
[im 22/30  brain]
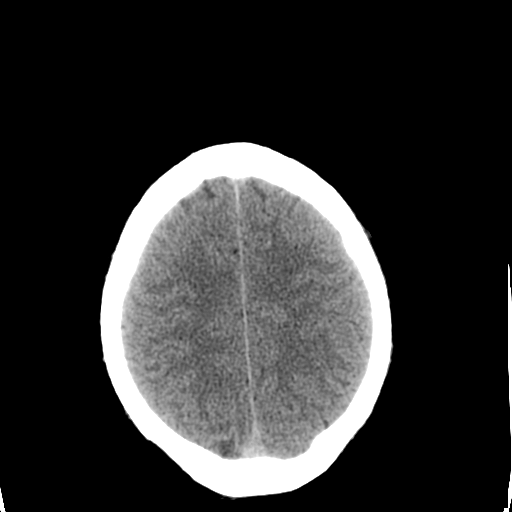
[im 23/30  brain]
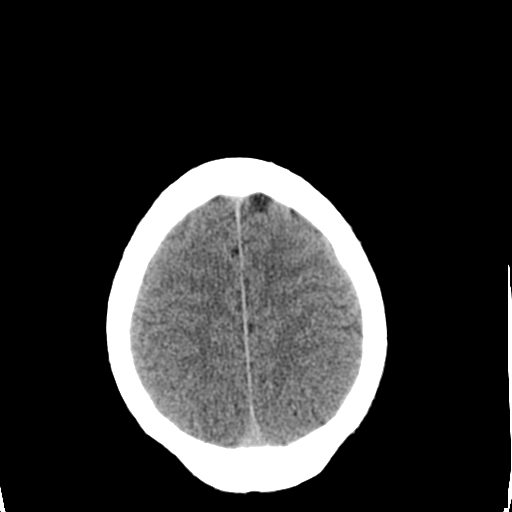
[im 23/30  bone]
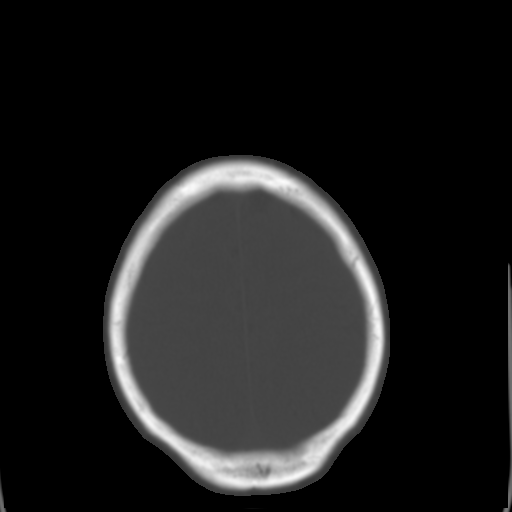
[im 25/30  brain]
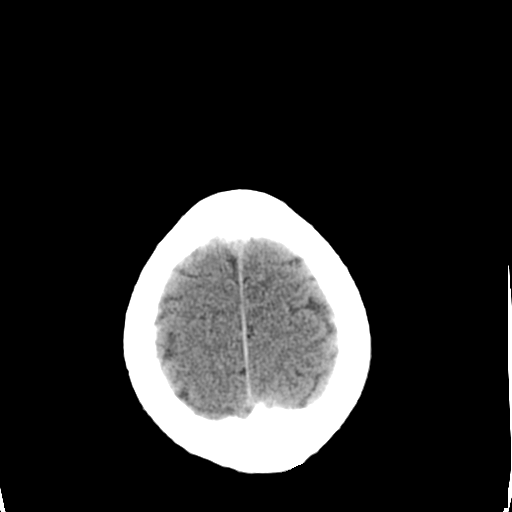
[im 27/30  brain]
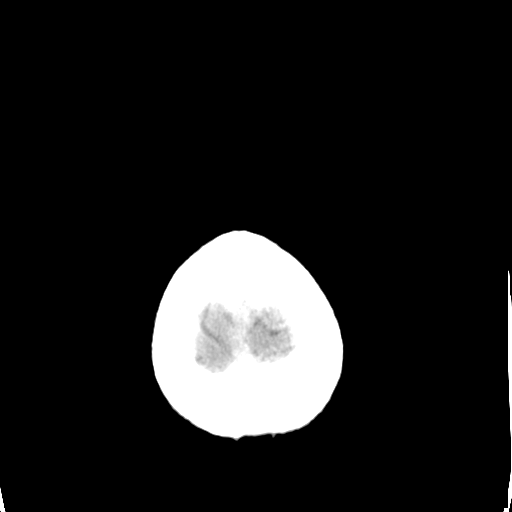
[im 29/30  brain]
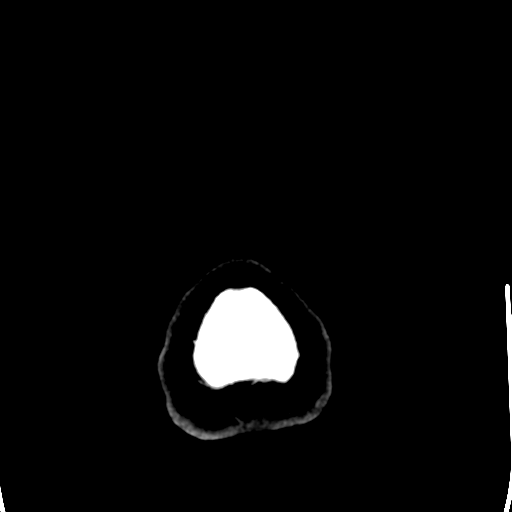

[16 of 30 positions shown; findings below may reference images not displayed]

FINDINGS: The ventricles are normal in size and configuration. No extra-axial
fluid collections are identified. The gray-white differentiation is
normal. No CT findings for acute intracranial process such as
hemorrhage or infarction. No mass lesions. The brainstem and
cerebellum are grossly normal.

The bony structures are intact. The paranasal sinuses and mastoid
air cells are clear. The globes are intact.
IMPRESSION: Normal head CT.

## 2020-03-18 ENCOUNTER — Other Ambulatory Visit: Payer: Self-pay

## 2020-03-18 DIAGNOSIS — Z20822 Contact with and (suspected) exposure to covid-19: Secondary | ICD-10-CM

## 2020-03-19 LAB — SARS-COV-2, NAA 2 DAY TAT

## 2020-03-19 LAB — SPECIMEN STATUS REPORT

## 2020-03-19 LAB — NOVEL CORONAVIRUS, NAA: SARS-CoV-2, NAA: DETECTED — AB

## 2020-03-20 ENCOUNTER — Telehealth: Payer: Self-pay | Admitting: Nurse Practitioner

## 2020-03-20 NOTE — Telephone Encounter (Signed)
Called to Discuss with patient about Covid symptoms and the use of the monoclonal antibody infusion for those with mild to moderate Covid symptoms and at a high risk of hospitalization.     Pt appears to qualify for this infusion due to co-morbid conditions and/or a member of an at-risk group in accordance with the FDA Emergency Use Authorization. (BMI >25, SVI high, AA male), Patient reports symptoms are mild with loss of taste and smell only.   He declines the infusion at this time but will call if symptoms worsen. Sx onset 03/18/20.   Willette Alma, NP [facility]  [phone]

## 2020-08-10 ENCOUNTER — Emergency Department (HOSPITAL_COMMUNITY)
Admission: EM | Admit: 2020-08-10 | Discharge: 2020-08-11 | Disposition: A | Discharge status: Home / Self Care | Payer: BC Managed Care – PPO | Attending: Emergency Medicine | Admitting: Emergency Medicine

## 2020-08-10 ENCOUNTER — Other Ambulatory Visit: Payer: Self-pay

## 2020-08-10 DIAGNOSIS — J45909 Unspecified asthma, uncomplicated: Secondary | ICD-10-CM | POA: Insufficient documentation

## 2020-08-10 DIAGNOSIS — R519 Headache, unspecified: Secondary | ICD-10-CM | POA: Diagnosis present

## 2020-08-10 DIAGNOSIS — G44209 Tension-type headache, unspecified, not intractable: Secondary | ICD-10-CM | POA: Diagnosis not present

## 2020-08-10 DIAGNOSIS — F17210 Nicotine dependence, cigarettes, uncomplicated: Secondary | ICD-10-CM | POA: Insufficient documentation

## 2020-08-10 NOTE — ED Triage Notes (Signed)
Pt with c/o R sided headache x 3 days. States has been taking Ibuprofen without relief.

## 2020-08-11 MED ORDER — DEXAMETHASONE SODIUM PHOSPHATE 10 MG/ML IJ SOLN
10.0000 mg | Freq: Once | INTRAMUSCULAR | Status: AC
  Administered 2020-08-11: 10 mg via INTRAVENOUS
  Filled 2020-08-11: qty 1

## 2020-08-11 MED ORDER — PROCHLORPERAZINE EDISYLATE 10 MG/2ML IJ SOLN
10.0000 mg | Freq: Once | INTRAMUSCULAR | Status: AC
  Administered 2020-08-11: 10 mg via INTRAVENOUS
  Filled 2020-08-11: qty 2

## 2020-08-11 MED ORDER — DIPHENHYDRAMINE HCL 50 MG/ML IJ SOLN
25.0000 mg | Freq: Once | INTRAMUSCULAR | Status: AC
  Administered 2020-08-11: 25 mg via INTRAVENOUS
  Filled 2020-08-11: qty 1

## 2020-08-11 MED ORDER — PROCHLORPERAZINE MALEATE 10 MG PO TABS: 10.0000 mg | ORAL_TABLET | Freq: Four times a day (QID) | ORAL | 0 refills | Status: AC | PRN

## 2020-08-11 MED ORDER — LACTATED RINGERS IV BOLUS
1000.0000 mL | Freq: Once | INTRAVENOUS | Status: AC
  Administered 2020-08-11: 1000 mL via INTRAVENOUS

## 2020-08-11 NOTE — ED Provider Notes (Signed)
[facility] EMERGENCY DEPARTMENT Provider Note   CSN: [id number] Arrival date & time: 08/10/20  2315   History Chief Complaint  Patient presents with  . Headache    Caleb Lee is a 45 y.o. male.  The history is provided by the patient.  Headache He has history of asthma and comes in complaining of a right retro-orbital headache for the last 3 days.  He cannot describe the pain but it is constant and he rates it at 9/10.  Ibuprofen gives slight, temporary relief.  He denies any visual change, nausea, vomiting.  He denies photophobia or phonophobia.  It seems to be worse when he lays down.  He denies fever, chills, sweats.  He denies stiff neck.  Past Medical History:  Diagnosis Date  . Asthma     There are no problems to display for this patient.   Past Surgical History:  Procedure Laterality Date  . APPENDECTOMY         No family history on file.  Social History   Tobacco Use  . Smoking status: Current Every Day Smoker    Packs/day: 0.50    Types: Cigarettes  . Smokeless tobacco: Never Used  Substance Use Topics  . Alcohol use: Yes    Comment: occ  . Drug use: No    Home Medications Prior to Admission medications   Medication Sig Start Date End Date Taking? Authorizing Provider  famotidine (PEPCID) 20 MG tablet Take 1 tablet (20 mg total) by mouth 2 (two) times daily. 03/19/16   Trixie Dredge, PA-C  ibuprofen (ADVIL,MOTRIN) 400 MG tablet Take 1 tablet (400 mg total) by mouth every 6 (six) hours as needed. 09/07/15   Mesner, Barbara Cower, MD  methocarbamol (ROBAXIN) 500 MG tablet Take 1-2 tablets (500-1,000 mg total) by mouth every 6 (six) hours as needed for muscle spasms (and pain). 03/19/16   Trixie Dredge, PA-C  ondansetron (ZOFRAN) 4 MG tablet Take 1 tablet (4 mg total) by mouth every 8 (eight) hours as needed for nausea or vomiting. 03/19/16   Trixie Dredge, PA-C  sucralfate (CARAFATE) 1 g tablet Take 1 tablet (1 g total) by mouth 4 (four) times daily -  with meals and  at bedtime. 03/19/16   Trixie Dredge, PA-C  tobramycin (TOBREX) 0.3 % ophthalmic solution Place 2 drops into both eyes every 4 (four) hours. 01/15/16   Elson Areas, PA-C    Allergies    Patient has no known allergies.  Review of Systems   Review of Systems  Neurological: Positive for headaches.  All other systems reviewed and are negative.   Physical Exam Updated Vital Signs BP 120/68   Pulse 79   Temp 98.1 F (36.7 C)   Resp 18   Ht 5\' 8"  (1.727 m)   Wt 78.9 kg   SpO2 95%   BMI 26.46 kg/m   Physical Exam Vitals and nursing note reviewed.   45 year old male, resting comfortably and in no acute distress. Vital signs are normal. Oxygen saturation is 95%, which is normal. Head is normocephalic and atraumatic. PERRLA, EOMI. Oropharynx is clear.  There is tenderness palpation over the right temporalis muscle and also in the insertion of the paracervical muscles on the right. Neck is nontender and supple without adenopathy or JVD. Back is nontender and there is no CVA tenderness. Lungs are clear without rales, wheezes, or rhonchi. Chest is nontender. Heart has regular rate and rhythm without murmur. Abdomen is soft, flat, nontender without masses or  hepatosplenomegaly and peristalsis is normoactive. Extremities have no cyanosis or edema, full range of motion is present. Skin is warm and dry without rash. Neurologic: Mental status is normal, cranial nerves are intact, there are no motor or sensory deficits.  ED Results / Procedures / Treatments    Procedures Procedures   Medications Ordered in ED Medications  dexamethasone (DECADRON) injection 10 mg (has no administration in time range)  lactated ringers bolus 1,000 mL (1,000 mLs Intravenous New Bag/Given 08/11/20 0222)  prochlorperazine (COMPAZINE) injection 10 mg (10 mg Intravenous Given 08/11/20 0219)  diphenhydrAMINE (BENADRYL) injection 25 mg (25 mg Intravenous Given 08/11/20 0219)    ED Course  I have reviewed the  triage vital signs and the nursing notes.  MDM Rules/Calculators/A&P Headache which has characteristic strongly suggestive of muscle contraction headache.  Doubt meningitis, subarachnoid hemorrhage.  No red flags to suggest more serious causes of headache.  Old records reviewed, and he has no relevant past visits.  He will be given a headache cocktail of lactated Ringer's, prochlorperazine, diphenhydramine and reassessed.  He had excellent relief of his headache with above-noted treatment.  He is given a dose of dexamethasone, and discharged with prescription for prochlorperazine.  Final Clinical Impression(s) / ED Diagnoses Final diagnoses:  Muscle contraction headache    Rx / DC Orders ED Discharge Orders    None       Dione Booze, MD 08/11/20 [phone]

## 2023-12-05 ENCOUNTER — Emergency Department (HOSPITAL_COMMUNITY)
Admission: EM | Admit: 2023-12-05 | Discharge: 2023-12-05 | Disposition: A | Discharge status: Home / Self Care | Payer: Self-pay | Attending: Emergency Medicine | Admitting: Emergency Medicine

## 2023-12-05 ENCOUNTER — Emergency Department (HOSPITAL_COMMUNITY): Payer: Self-pay

## 2023-12-05 ENCOUNTER — Other Ambulatory Visit: Payer: Self-pay

## 2023-12-05 ENCOUNTER — Encounter (HOSPITAL_COMMUNITY): Payer: Self-pay | Admitting: Emergency Medicine

## 2023-12-05 DIAGNOSIS — Y92410 Unspecified street and highway as the place of occurrence of the external cause: Secondary | ICD-10-CM | POA: Diagnosis not present

## 2023-12-05 DIAGNOSIS — M549 Dorsalgia, unspecified: Secondary | ICD-10-CM

## 2023-12-05 DIAGNOSIS — M546 Pain in thoracic spine: Secondary | ICD-10-CM | POA: Insufficient documentation

## 2023-12-05 DIAGNOSIS — M542 Cervicalgia: Secondary | ICD-10-CM | POA: Insufficient documentation

## 2023-12-05 MED ORDER — MELOXICAM 7.5 MG PO TABS: 7.5000 mg | ORAL_TABLET | Freq: Every day | ORAL | 0 refills | Status: AC
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
## 2023-12-05 NOTE — ED Notes (Signed)
Patient transported to CT/XR ?

## 2023-12-05 NOTE — ED Notes (Signed)
 ED Provider at bedside.
